# Patient Record
Sex: Female | Born: 1970 | Race: Black or African American | Hispanic: No | Marital: Married | Smoking: Never smoker
Health system: Southern US, Community
[De-identification: ages and names within clinical notes are randomized; demographics above are authoritative.]

## PROBLEM LIST (undated history)

## (undated) DIAGNOSIS — E079 Disorder of thyroid, unspecified: Secondary | ICD-10-CM

## (undated) DIAGNOSIS — M797 Fibromyalgia: Secondary | ICD-10-CM

---

## 2001-10-28 ENCOUNTER — Emergency Department (HOSPITAL_COMMUNITY): Admission: EM | Admit: 2001-10-28 | Discharge: 2001-10-28 | Payer: Self-pay

## 2005-10-10 ENCOUNTER — Encounter: Admission: RE | Admit: 2005-10-10 | Discharge: 2005-10-10 | Payer: Self-pay | Admitting: Family Medicine

## 2005-10-17 ENCOUNTER — Encounter: Admission: RE | Admit: 2005-10-17 | Discharge: 2005-10-17 | Payer: Self-pay | Admitting: Family Medicine

## 2008-07-26 ENCOUNTER — Encounter (HOSPITAL_COMMUNITY): Admission: RE | Admit: 2008-07-26 | Discharge: 2008-08-25 | Payer: Self-pay | Admitting: Internal Medicine

## 2008-08-12 ENCOUNTER — Encounter (HOSPITAL_COMMUNITY): Admission: RE | Admit: 2008-08-12 | Discharge: 2008-11-10 | Payer: Self-pay | Admitting: Internal Medicine

## 2009-08-30 ENCOUNTER — Emergency Department (HOSPITAL_COMMUNITY): Admission: EM | Admit: 2009-08-30 | Discharge: 2009-08-30 | Payer: Self-pay | Admitting: Emergency Medicine

## 2010-04-19 ENCOUNTER — Emergency Department (HOSPITAL_COMMUNITY): Admission: EM | Admit: 2010-04-19 | Discharge: 2010-04-19 | Payer: Self-pay | Admitting: Emergency Medicine

## 2010-05-06 IMAGING — CT CT ABD-PELV W/ CM
1 series · 16 of 32 positions shown, 20 images · IV contrast (agent unspecified)
Comparison: None.

CLINICAL DATA: Abdominal pain and nausea.  Previous C-section.

CT ABDOMEN AND PELVIS WITH CONTRAST
TECHNIQUE: Multidetector CT imaging of the abdomen and pelvis was
performed following the standard protocol during bolus
administration of intravenous contrast.
Contrast: 100 ml Ymnipaque-SEE

[Series 2: rtn ap with st · axial · 0.66mm/px · z∈[-466,-42]mm · 16 of 96 slices shown, 20 images]
[im 7/96  soft-tissue]
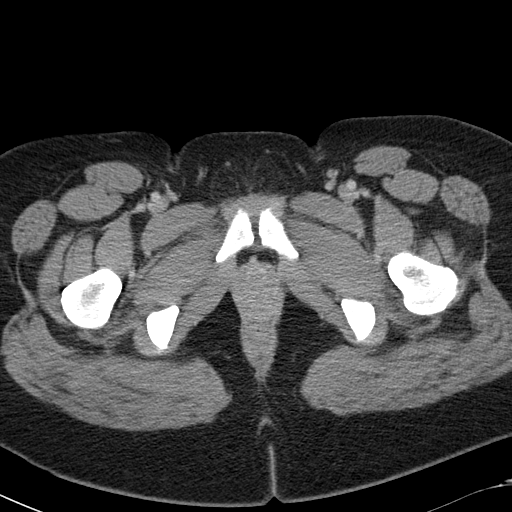
[im 7/96  bone]
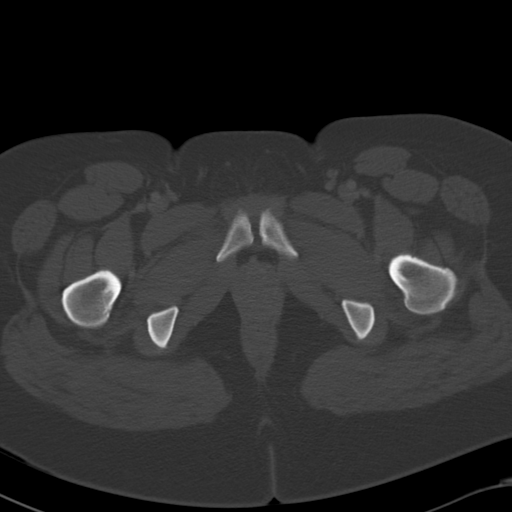
[im 13/96  soft-tissue]
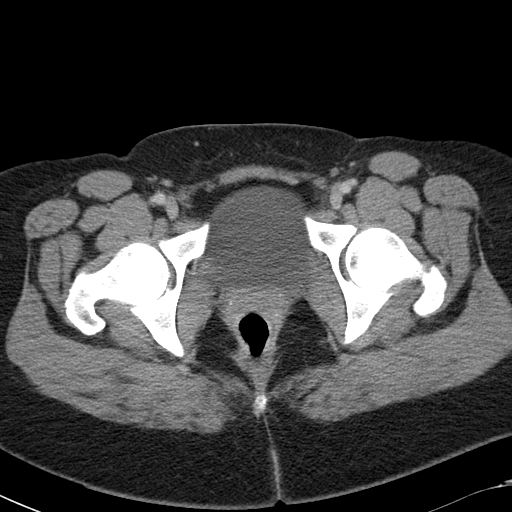
[im 19/96  soft-tissue]
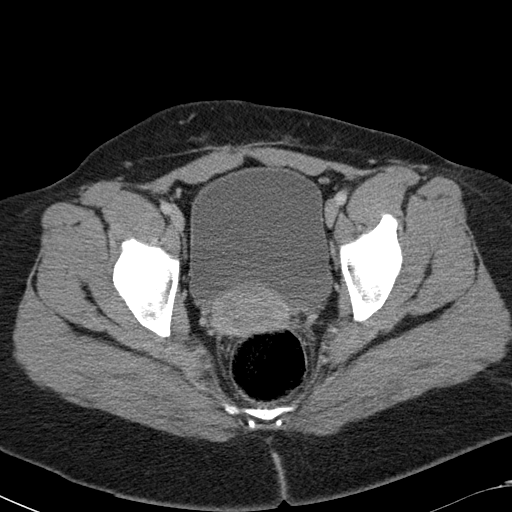
[im 25/96  soft-tissue]
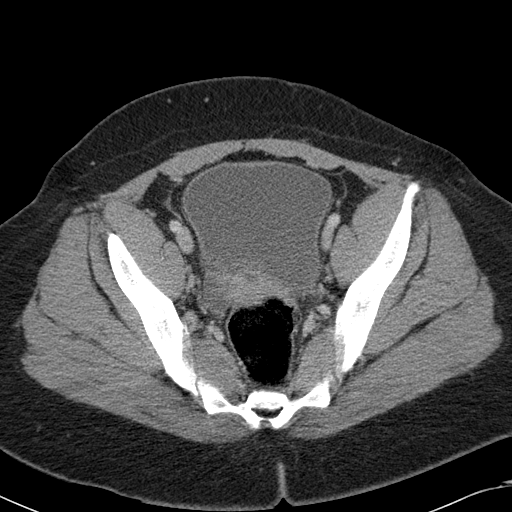
[im 31/96  soft-tissue]
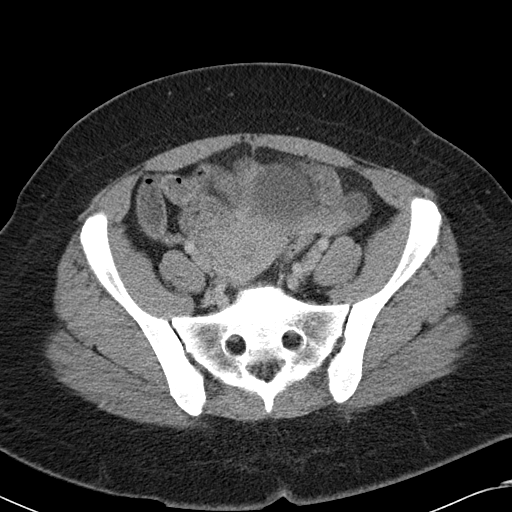
[im 37/96  soft-tissue]
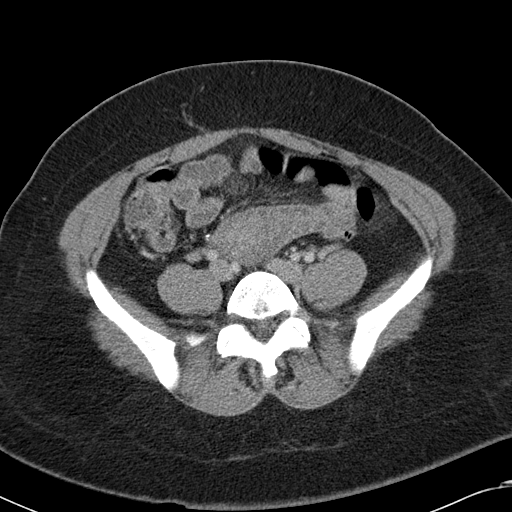
[im 43/96  soft-tissue]
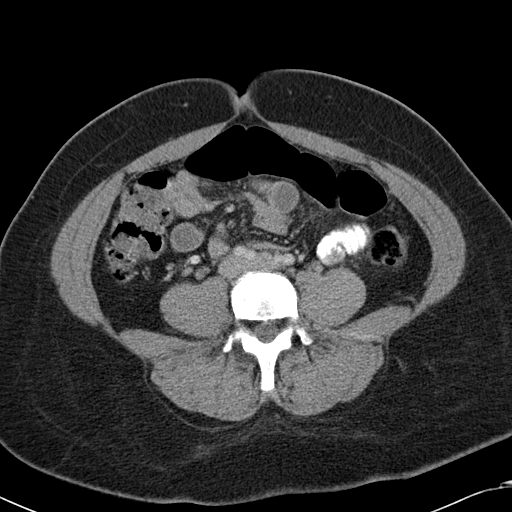
[im 53/96  soft-tissue]
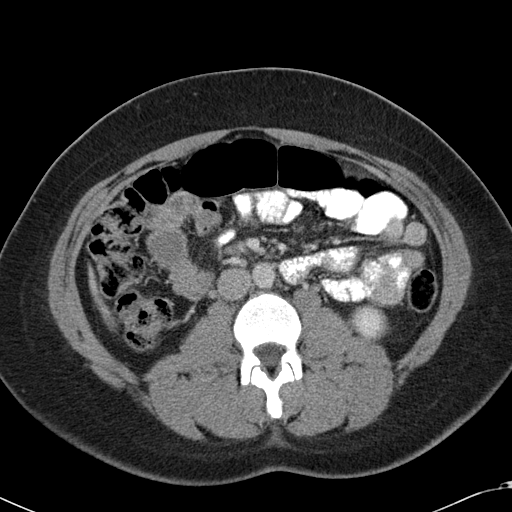
[im 59/96  soft-tissue]
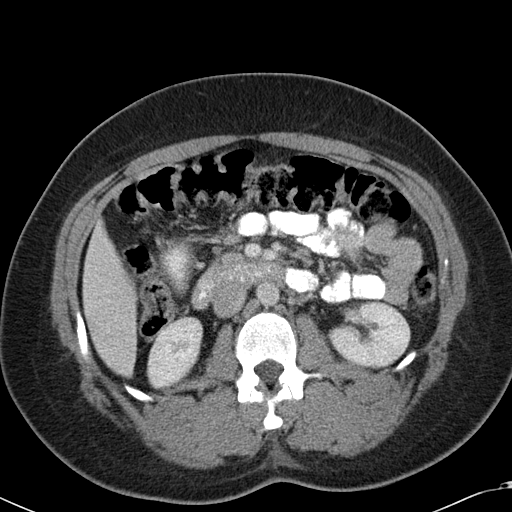
[im 59/96  bone]
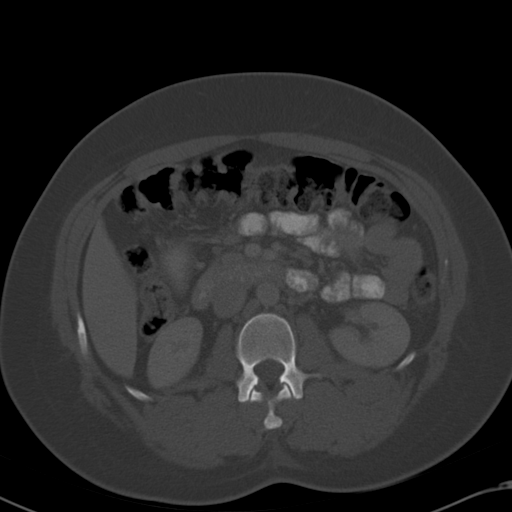
[im 65/96  soft-tissue]
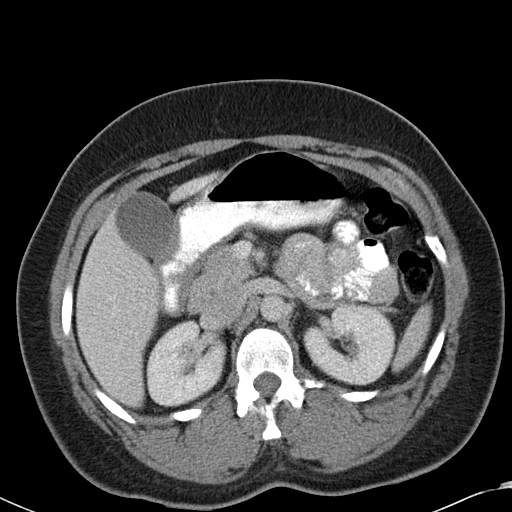
[im 71/96  soft-tissue]
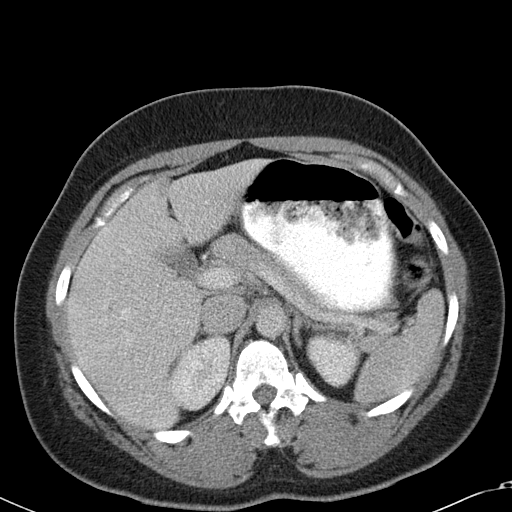
[im 77/96  soft-tissue]
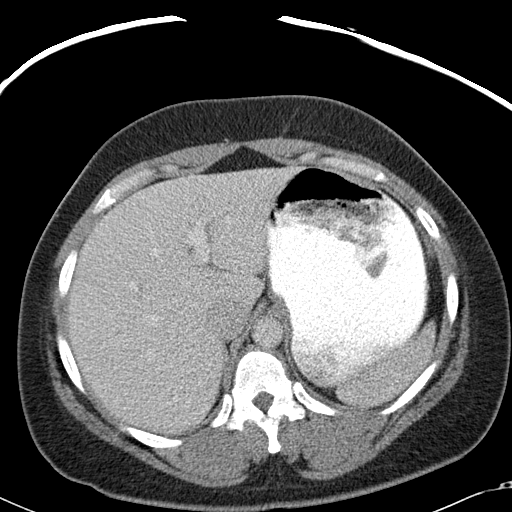
[im 83/96  soft-tissue]
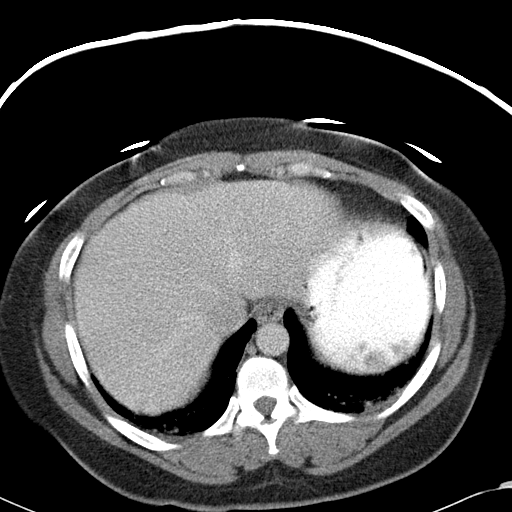
[im 83/96  lung]
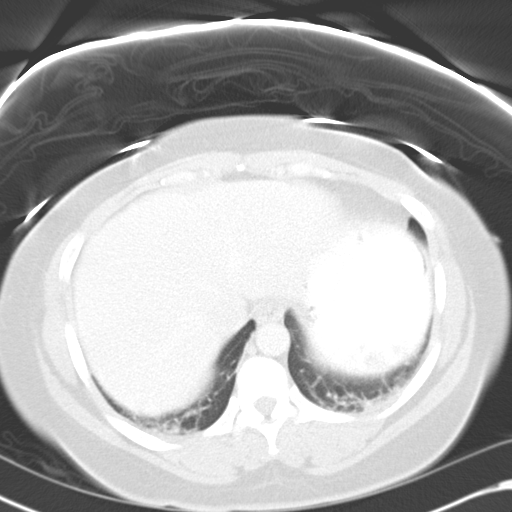
[im 86/96  lung]
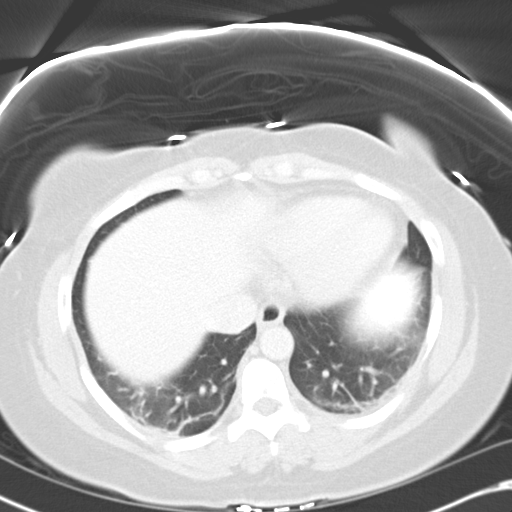
[im 89/96  soft-tissue]
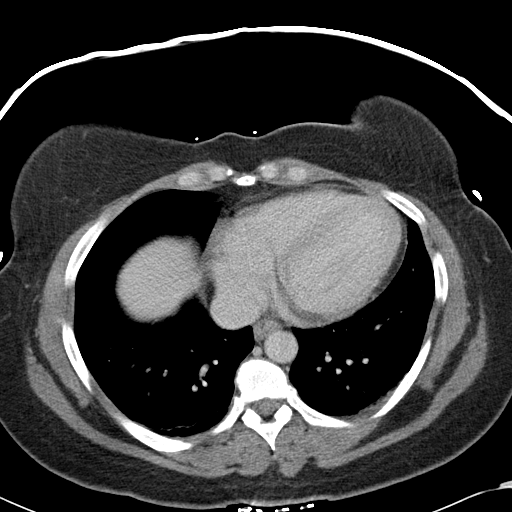
[im 89/96  lung]
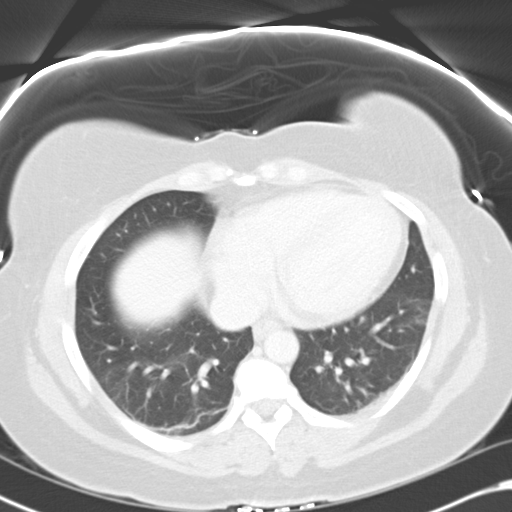
[im 92/96  lung]
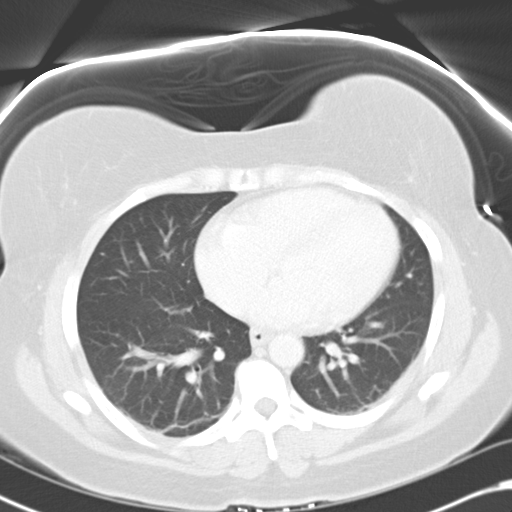

[16 of 32 positions shown; findings below may reference images not displayed]

FINDINGS: Normal appearing liver, spleen, pancreas, gallbladder,
adrenal glands, kidneys, urinary bladder, uterus and ovaries
containing follicles.  Mild diffuse low density wall thickening
involving the gastric antrum, measuring 5 mm in maximum thickness.
No intestinal abnormalities or enlarged lymph nodes.  Normal
appearing appendix.  No free peritoneal fluid or air.  Mild
atelectasis or scarring in both lung bases.  Mild disc space
narrowing, vacuum phenomenon and diffuse posterior disc bulging and
spur formation at the L4-5 level.
IMPRESSION: 1.  Mild diffuse low density antral wall thickening, most likely
due to gastritis.
2.  L4-5 degenerative changes.

## 2010-06-24 ENCOUNTER — Encounter: Payer: Self-pay | Admitting: Internal Medicine

## 2010-08-26 LAB — COMPREHENSIVE METABOLIC PANEL
ALT: 13 U/L (ref 0–35)
ALT: 15 U/L (ref 0–35)
Alkaline Phosphatase: 66 U/L (ref 39–117)
Alkaline Phosphatase: 67 U/L (ref 39–117)
BUN: 7 mg/dL (ref 6–23)
BUN: 8 mg/dL (ref 6–23)
CO2: 25 mEq/L (ref 19–32)
Calcium: 8.5 mg/dL (ref 8.4–10.5)
Chloride: 106 mEq/L (ref 96–112)
GFR calc non Af Amer: >60 mL/min (ref >60)
Glucose, Bld: 66 mg/dL — ABNORMAL LOW (ref 70–99)
Glucose, Bld: 84 mg/dL (ref 70–99)
Potassium: 4 mEq/L (ref 3.5–5.1)
Sodium: 137 mEq/L (ref 135–145)
Sodium: 138 mEq/L (ref 135–145)
Total Bilirubin: 0.7 mg/dL (ref 0.3–1.2)

## 2010-08-26 LAB — URINALYSIS, ROUTINE W REFLEX MICROSCOPIC
Bilirubin Urine: NEGATIVE
Glucose, UA: NEGATIVE mg/dL
Ketones, ur: NEGATIVE mg/dL
Nitrite: NEGATIVE
Protein, ur: NEGATIVE mg/dL

## 2010-08-26 LAB — CBC
HCT: 34.5 % — ABNORMAL LOW (ref 36.0–46.0)
Hemoglobin: 11.3 g/dL — ABNORMAL LOW (ref 12.0–15.0)
MCHC: 32.7 g/dL (ref 30.0–36.0)
RBC: 3.71 MIL/uL — ABNORMAL LOW (ref 3.87–5.11)
RDW: 14.7 % (ref 11.5–15.5)

## 2010-08-26 LAB — WET PREP, GENITAL: Yeast Wet Prep HPF POC: NONE SEEN

## 2010-08-26 LAB — URINE CULTURE: Colony Count: 30000

## 2010-08-26 LAB — LIPASE, BLOOD
Lipase: 16 U/L (ref 11–59)
Lipase: 16 U/L (ref 11–59)

## 2010-08-26 LAB — GC/CHLAMYDIA PROBE AMP, GENITAL
Chlamydia, DNA Probe: NEGATIVE
GC Probe Amp, Genital: NEGATIVE

## 2010-09-13 LAB — HCG, SERUM, QUALITATIVE: Preg, Serum: NEGATIVE

## 2011-07-25 ENCOUNTER — Encounter (HOSPITAL_BASED_OUTPATIENT_CLINIC_OR_DEPARTMENT_OTHER): Payer: Self-pay | Admitting: *Deleted

## 2011-07-25 ENCOUNTER — Other Ambulatory Visit: Payer: Self-pay

## 2011-07-25 ENCOUNTER — Emergency Department (HOSPITAL_BASED_OUTPATIENT_CLINIC_OR_DEPARTMENT_OTHER)
Admission: EM | Admit: 2011-07-25 | Discharge: 2011-07-25 | Disposition: A | Discharge status: Home / Self Care | Payer: Self-pay | Attending: Emergency Medicine | Admitting: Emergency Medicine

## 2011-07-25 DIAGNOSIS — K219 Gastro-esophageal reflux disease without esophagitis: Secondary | ICD-10-CM | POA: Insufficient documentation

## 2011-07-25 DIAGNOSIS — R079 Chest pain, unspecified: Secondary | ICD-10-CM | POA: Insufficient documentation

## 2011-07-25 HISTORY — DX: Disorder of thyroid, unspecified: E07.9

## 2011-07-25 LAB — DIFFERENTIAL
Basophils Absolute: 0 10*3/uL (ref 0.0–0.1)
Basophils Relative: 0 % (ref 0–1)
Eosinophils Absolute: 0.3 10*3/uL (ref 0.0–0.7)
Neutro Abs: 4.5 10*3/uL (ref 1.7–7.7)
Neutrophils Relative %: 69 % (ref 43–77)

## 2011-07-25 LAB — CBC
MCH: 29.1 pg (ref 26.0–34.0)
MCHC: 32.8 g/dL (ref 30.0–36.0)
Platelets: 263 10*3/uL (ref 150–400)

## 2011-07-25 LAB — COMPREHENSIVE METABOLIC PANEL
ALT: 11 U/L (ref 0–35)
AST: 14 U/L (ref 0–37)
Albumin: 3 g/dL — ABNORMAL LOW (ref 3.5–5.2)
Alkaline Phosphatase: 100 U/L (ref 39–117)
Potassium: 4.1 mEq/L (ref 3.5–5.1)
Sodium: 139 mEq/L (ref 135–145)
Total Protein: 7.2 g/dL (ref 6.0–8.3)

## 2011-07-25 LAB — CARDIAC PANEL(CRET KIN+CKTOT+MB+TROPI)
Relative Index: 1.4 (ref 0.0–2.5)
Total CK: 128 U/L (ref 7–177)

## 2011-07-25 MED ORDER — RANITIDINE HCL 150 MG PO TABS: 150.0000 mg | ORAL_TABLET | Freq: Two times a day (BID) | ORAL | Status: AC

## 2011-07-25 MED ORDER — GI COCKTAIL ~~LOC~~
30.0000 mL | Freq: Once | ORAL | Status: AC
  Administered 2011-07-25: 30 mL via ORAL
  Filled 2011-07-25: qty 30

## 2011-07-25 NOTE — ED Provider Notes (Addendum)
History     CSN: [id number]  Arrival date & time 07/25/11  1114   First MD Initiated Contact with Patient 07/25/11 1133      Chief Complaint  Patient presents with  . Chest Pain    (Consider location/radiation/quality/duration/timing/severity/associated sxs/prior treatment) Patient is a 41 y.o. female presenting with chest pain. The history is provided by the patient.  Chest Pain The chest pain began yesterday. Duration of episode(s) is 12 hours. Chest pain occurs constantly. The chest pain is unchanged. Associated with: started after eating. At its most intense, the pain is at 5/10. The pain is currently at 5/10. The severity of the pain is moderate. The quality of the pain is described as pressure-like, burning, aching and squeezing. The pain does not radiate. Chest pain is worsened by certain positions (worse with lying down and bending over). Pertinent negatives for primary symptoms include no fever, no fatigue, no syncope, no shortness of breath, no cough, no wheezing, no palpitations, no abdominal pain, no nausea and no vomiting.  Pertinent negatives for associated symptoms include no diaphoresis, no lower extremity edema, no orthopnea and no weakness. She tried aspirin for the symptoms. Risk factors include no known risk factors.  Pertinent negatives for past medical history include no CAD, no diabetes, no DVT, no hyperlipidemia and no hypertension.  Pertinent negatives for family medical history include: no heart disease in family and no early MI in family.     Past Medical History  Diagnosis Date  . Thyroid disease     History reviewed. No pertinent past surgical history.  History reviewed. No pertinent family history.  History  Substance Use Topics  . Smoking status: Not on file  . Smokeless tobacco: Not on file  . Alcohol Use:     OB History    Grav Para Term Preterm Abortions TAB SAB Ect Mult Living                  Review of Systems  Constitutional:  Negative for fever, diaphoresis and fatigue.  Respiratory: Negative for cough, shortness of breath and wheezing.   Cardiovascular: Positive for chest pain. Negative for palpitations, orthopnea and syncope.  Gastrointestinal: Negative for nausea, vomiting and abdominal pain.  Neurological: Negative for weakness.  All other systems reviewed and are negative.    Allergies  Review of patient's allergies indicates no known allergies.  Home Medications   Current Outpatient Rx  Name Route Sig Dispense Refill  . DESOGESTREL-ETHINYL ESTRADIOL 0.15-0.02/0.01 MG (21/5) PO TABS Oral Take 1 tablet by mouth daily.    Marland Kitchen LEVOTHYROXINE SODIUM 150 MCG PO TABS Oral Take 150 mcg by mouth daily.      BP 144/91  Pulse 88  Temp(Src) 98.2 F (36.8 C) (Oral)  Resp 18  Ht 5\' 5"  (1.651 m)  Wt 256 lb (116.121 kg)  BMI 42.60 kg/m2  SpO2 100%  LMP 07/11/2011  Physical Exam  Nursing note and vitals reviewed. Constitutional: She is oriented to person, place, and time. She appears well-developed and well-nourished. No distress.  HENT:  Head: Normocephalic and atraumatic.  Mouth/Throat: Oropharynx is clear and moist.  Eyes: Conjunctivae and EOM are normal. Pupils are equal, round, and reactive to light.  Neck: Normal range of motion. Neck supple.  Cardiovascular: Normal rate, regular rhythm and intact distal pulses.   No murmur heard. Pulmonary/Chest: Effort normal and breath sounds normal. No respiratory distress. She has no wheezes. She has no rales. She exhibits tenderness.  Abdominal: Soft. She exhibits no  distension. There is no tenderness. There is no rebound and no guarding.  Musculoskeletal: Normal range of motion. She exhibits no edema and no tenderness.  Neurological: She is alert and oriented to person, place, and time.  Skin: Skin is warm and dry. No rash noted. No erythema.  Psychiatric: She has a normal mood and affect. Her behavior is normal.    ED Course  Procedures (including  critical care time)  Labs Reviewed  CBC - Abnormal; Notable for the following:    Hemoglobin 11.8 (*)    All other components within normal limits  COMPREHENSIVE METABOLIC PANEL - Abnormal; Notable for the following:    Albumin 3.0 (*)    GFR calc non Af Amer 80 (*)    All other components within normal limits  DIFFERENTIAL  CARDIAC PANEL(CRET KIN+CKTOT+MB+TROPI)   No results found.   Date: 07/25/2011  Rate: 84  Rhythm: normal sinus rhythm  QRS Axis: normal  Intervals: normal  ST/T Wave abnormalities: normal  Conduction Disutrbances:none  Narrative Interpretation:   Old EKG Reviewed: unchanged from EKG at urgent care   1. GERD (gastroesophageal reflux disease)       MDM   Patient with chest pain that developed last night after dinner. She states it feels like a pressure and it's worse with lying down and bending over. She has no shortness of breath, diaphoresis, nausea, vomiting. She was seen at [facility] and had a normal EKG and chest x-ray but was sent here for further evaluation. Patient has no symptoms concerning for PE at this time. Low concern for cardiac cause as she is a TIMI 0 and symptoms are more suggestive of GERD. However since symptoms have been ongoing for over 12 hours feel 1 set of cardiac enzymes would be sufficient to rule patient out.  She just had a chest x-ray done today that was within normal limits will not repeat.  12:30 PM All labs within normal limits. This is unlikely to be cardiac or respiratory in origin. Feel most likely GERD. After her GI cocktail improvement in symptoms    Gwyneth Sprout, MD 07/25/11 1303  Gwyneth Sprout, MD 07/25/11 1325

## 2011-07-25 NOTE — Discharge Instructions (Signed)
Diet for GERD or PUD Nutrition therapy can help ease the discomfort of gastroesophageal reflux disease (GERD) and peptic ulcer disease (PUD).  HOME CARE INSTRUCTIONS   Eat your meals slowly, in a relaxed setting.   Eat 5 to 6 small meals per day.   If a food causes distress, stop eating it for a period of time.  FOODS TO AVOID  Coffee, regular or decaffeinated.   Cola beverages, regular or low calorie.   Tea, regular or decaffeinated.   Pepper.   Cocoa.   High fat foods, including meats.   Butter, margarine, hydrogenated oil (trans fats).   Peppermint or spearmint (if you have GERD).   Fruits and vegetables if not tolerated.   Alcohol.   Nicotine (smoking or chewing). This is one of the most potent stimulants to acid production in the gastrointestinal tract.   Any food that seems to aggravate your condition.  If you have questions regarding your diet, ask your caregiver or a registered dietitian. TIPS  Lying flat may make symptoms worse. Keep the head of your bed raised 6 to 9 inches (15 to 23 cm) by using a foam wedge or blocks under the legs of the bed.   Do not lay down until 3 hours after eating a meal.   Daily physical activity may help reduce symptoms.  MAKE SURE YOU:   Understand these instructions.   Will watch your condition.   Will get help right away if you are not doing well or get worse.  Document Released: 05/20/2005 Document Revised: 01/30/2011 Document Reviewed: 10/03/2008 [employer] Patient Information 2012 [employer]. 

## 2011-07-25 NOTE — ED Notes (Signed)
Pt amb to room 5 with quick steady gait, declines w/c for transport. Pt reports chest pain since last night, states pain increases with moving her arm, lying down, deep inspiration, and bending over. Pt seen at [facility] prior to her arrival here.

## 2020-04-10 ENCOUNTER — Ambulatory Visit: Payer: BC Managed Care – PPO | Admitting: Psychologist

## 2020-04-19 ENCOUNTER — Ambulatory Visit: Payer: BC Managed Care – PPO | Admitting: Psychology

## 2020-06-08 ENCOUNTER — Ambulatory Visit: Payer: BC Managed Care – PPO | Admitting: Psychologist

## 2021-05-25 ENCOUNTER — Encounter (HOSPITAL_BASED_OUTPATIENT_CLINIC_OR_DEPARTMENT_OTHER): Payer: Self-pay | Admitting: Emergency Medicine

## 2021-05-25 ENCOUNTER — Ambulatory Visit: Admission: EM | Admit: 2021-05-25 | Discharge: 2021-05-25 | Payer: BC Managed Care – PPO

## 2021-05-25 ENCOUNTER — Emergency Department (HOSPITAL_BASED_OUTPATIENT_CLINIC_OR_DEPARTMENT_OTHER)
Admission: EM | Admit: 2021-05-25 | Discharge: 2021-05-25 | Disposition: A | Discharge status: Home / Self Care | Payer: BC Managed Care – PPO | Attending: Emergency Medicine | Admitting: Emergency Medicine

## 2021-05-25 ENCOUNTER — Other Ambulatory Visit: Payer: Self-pay

## 2021-05-25 DIAGNOSIS — I10 Essential (primary) hypertension: Secondary | ICD-10-CM | POA: Diagnosis not present

## 2021-05-25 DIAGNOSIS — H538 Other visual disturbances: Secondary | ICD-10-CM

## 2021-05-25 DIAGNOSIS — Z79899 Other long term (current) drug therapy: Secondary | ICD-10-CM | POA: Insufficient documentation

## 2021-05-25 DIAGNOSIS — Z91040 Latex allergy status: Secondary | ICD-10-CM | POA: Diagnosis not present

## 2021-05-25 DIAGNOSIS — E039 Hypothyroidism, unspecified: Secondary | ICD-10-CM | POA: Diagnosis not present

## 2021-05-25 HISTORY — DX: Fibromyalgia: M79.7

## 2021-05-25 LAB — BASIC METABOLIC PANEL
Anion gap: 8 (ref 5–15)
BUN: 10 mg/dL (ref 6–20)
CO2: 22 mmol/L (ref 22–32)
Calcium: 8.7 mg/dL — ABNORMAL LOW (ref 8.9–10.3)
Chloride: 106 mmol/L (ref 98–111)
Creatinine, Ser: 0.89 mg/dL (ref 0.44–1.00)
GFR, Estimated: >60 mL/min (ref >60)
Glucose, Bld: 85 mg/dL (ref 70–99)
Potassium: 4.2 mmol/L (ref 3.5–5.1)
Sodium: 136 mmol/L (ref 135–145)

## 2021-05-25 LAB — CBC WITH DIFFERENTIAL/PLATELET
Abs Immature Granulocytes: 0.01 10*3/uL (ref 0.00–0.07)
Basophils Absolute: 0 10*3/uL (ref 0.0–0.1)
Basophils Relative: 1 %
Eosinophils Absolute: 0.2 10*3/uL (ref 0.0–0.5)
Eosinophils Relative: 3 %
HCT: 40.2 % (ref 36.0–46.0)
Hemoglobin: 13.4 g/dL (ref 12.0–15.0)
Immature Granulocytes: 0 %
Lymphocytes Relative: 30 %
Lymphs Abs: 1.4 10*3/uL (ref 0.7–4.0)
MCH: 32.6 pg (ref 26.0–34.0)
MCHC: 33.3 g/dL (ref 30.0–36.0)
MCV: 97.8 fL (ref 80.0–100.0)
Monocytes Absolute: 0.3 10*3/uL (ref 0.1–1.0)
Monocytes Relative: 6 %
Neutro Abs: 2.9 10*3/uL (ref 1.7–7.7)
Neutrophils Relative %: 60 %
Platelets: 247 10*3/uL (ref 150–400)
RBC: 4.11 MIL/uL (ref 3.87–5.11)
RDW: 12.9 % (ref 11.5–15.5)
WBC: 4.8 10*3/uL (ref 4.0–10.5)
nRBC: 0 % (ref 0.0–0.2)

## 2021-05-25 MED ORDER — AMLODIPINE BESYLATE 5 MG PO TABS
5.0000 mg | ORAL_TABLET | Freq: Once | ORAL | Status: AC
  Administered 2021-05-25: 14:00:00 5 mg via ORAL
  Filled 2021-05-25: qty 1

## 2021-05-25 MED ORDER — AMLODIPINE BESYLATE 5 MG PO TABS: 5.0000 mg | ORAL_TABLET | Freq: Every day | ORAL | 0 refills | Status: AC

## 2021-05-25 NOTE — ED Provider Notes (Signed)
[facility]    CSN: [id number] Arrival date & time: 05/25/21  6546      History   Chief Complaint Chief Complaint  Patient presents with   Headache   Hypertension   Dizziness    HPI Hannah Johnson is a 50 y.o. female.   Patient here today for evaluation of hypertension, dizziness and headache that she has had the last several days.  She states today that she started to have blurred vision which brought her into the office.  She states yesterday her blood pressure was 200/110.  She does not have history of hypertension.  She does have history of hypothyroidism.  She denies any chest pain or shortness of breath.  The history is provided by the patient.  Headache Associated symptoms: dizziness   Associated symptoms: no abdominal pain, no fever, no nausea and no vomiting   Hypertension Associated symptoms include headaches. Pertinent negatives include no chest pain, no abdominal pain and no shortness of breath.  Dizziness Associated symptoms: headaches   Associated symptoms: no chest pain, no nausea, no shortness of breath and no vomiting    Past Medical History:  Diagnosis Date   Fibromyalgia    Thyroid disease     There are no problems to display for this patient.   Past Surgical History:  Procedure Laterality Date   CESAREAN SECTION     ENDOMETRIAL ABLATION     LAPAROSCOPIC GASTRIC SLEEVE RESECTION      OB History   No obstetric history on file.      Home Medications    Prior to Admission medications   Medication Sig Start Date End Date Taking? Authorizing Provider  diclofenac (VOLTAREN) 50 MG EC tablet Take by mouth. 11/02/16  Yes [provider]  Docusate Sodium (DSS) 100 MG CAPS Take by mouth. 04/29/16  Yes [provider]  gabapentin (NEURONTIN) 300 MG capsule Take by mouth. 07/13/15  Yes [provider]  levothyroxine (SYNTHROID) 88 MCG tablet TAKE 1 TABLET BY MOUTH 5 DAYS A WEEK AND 1&1/2 TABLETS ON FRIDAY AND  SUNDAY 04/08/16  Yes [provider]  cyclobenzaprine (FLEXERIL) 10 MG tablet Take 10 mg by mouth at bedtime. 02/22/21   [provider]  desogestrel-ethinyl estradiol (KARIVA,AZURETTE,MIRCETTE) 0.15-0.02/0.01 MG (21/5) tablet Take 1 tablet by mouth daily.    [provider]  levothyroxine (SYNTHROID, LEVOTHROID) 150 MCG tablet Take 150 mcg by mouth daily.    [provider]  medroxyPROGESTERone (DEPO-PROVERA) 150 MG/ML injection Inject 150 mg into the muscle every 3 (three) months. 03/12/21   [provider]  meloxicam (MOBIC) 15 MG tablet Take by mouth.    [provider]  ranitidine (ZANTAC) 150 MG tablet Take 1 tablet (150 mg total) by mouth 2 (two) times daily. 07/25/11 07/24/12  Gwyneth Sprout, MD    Family History History reviewed. No pertinent family history.  Social History Social History   Tobacco Use   Smoking status: Never   Smokeless tobacco: Never  Vaping Use   Vaping Use: Never used  Substance Use Topics   Alcohol use: Yes    Comment: social drinker   Drug use: Never     Allergies   Latex, Amantadine, Cetylpyridinium, Erythromycin, and Phenol   Review of Systems Review of Systems  Constitutional:  Negative for chills and fever.  Eyes:  Positive for visual disturbance. Negative for discharge and redness.  Respiratory:  Negative for shortness of breath and wheezing.   Cardiovascular:  Negative for chest  pain.  Gastrointestinal:  Negative for abdominal pain, nausea and vomiting.  Genitourinary:  Positive for vaginal bleeding and vaginal discharge.  Neurological:  Positive for dizziness and headaches.    Physical Exam Triage Vital Signs ED Triage Vitals  Enc Vitals Group     BP 05/25/21 1010 (!) 157/107     Pulse Rate 05/25/21 1010 95     Resp 05/25/21 1010 16     Temp 05/25/21 1010 98.4 F (36.9 C)     Temp Source 05/25/21 1010 Oral     SpO2 05/25/21 1010 97 %     Weight --      Height --      Head  Circumference --      Peak Flow --      Pain Score 05/25/21 1016 3     Pain Loc --      Pain Edu? --      Excl. in [place]? --    No data found.  Updated Vital Signs BP (!) 157/107 (BP Location: Right Arm)    Pulse 95    Temp 98.4 F (36.9 C) (Oral)    Resp 16    SpO2 97%      Physical Exam Vitals and nursing note reviewed.  Constitutional:      General: She is not in acute distress.    Appearance: Normal appearance. She is not ill-appearing.  HENT:     Head: Normocephalic and atraumatic.  Eyes:     Conjunctiva/sclera: Conjunctivae normal.  Cardiovascular:     Rate and Rhythm: Normal rate and regular rhythm.     Heart sounds: Normal heart sounds. No murmur heard. Pulmonary:     Effort: Pulmonary effort is normal. No respiratory distress.     Breath sounds: Normal breath sounds. No wheezing, rhonchi or rales.  Neurological:     Mental Status: She is alert.  Psychiatric:        Mood and Affect: Mood normal.        Behavior: Behavior normal.        Thought Content: Thought content normal.     UC Treatments / Results  Labs (all labs ordered are listed, but only abnormal results are displayed) Labs Reviewed - No data to display  EKG   Radiology No results found.  Procedures Procedures (including critical care time)  Medications Ordered in UC Medications - No data to display  Initial Impression / Assessment and Plan / UC Course  I have reviewed the triage vital signs and the nursing notes.  Pertinent labs & imaging results that were available during my care of the patient were reviewed by me and considered in my medical decision making (see chart for details).    Given current symptoms with elevated blood pressure that is abnormal for patient recommended follow-up in the emergency department for further evaluation, specifically stat labs.  Patient is agreeable to same.  Final Clinical Impressions(s) / UC Diagnoses   Final diagnoses:  Hypertension, unspecified  type  Blurred vision     Discharge Instructions      Please report to [facility] after leaving our office  [place], [place] [address]     ED Prescriptions   None    PDMP not reviewed this encounter.   Francene Finders, PA-C 05/25/21 1026

## 2021-05-25 NOTE — ED Triage Notes (Signed)
Reports bp was elevated yesterday with SBP greater than 200.  Seen at [facility] today.  SBP 150's.  Reports they told her to come here.  C/o headache for several days that comes and goes and blurred vision that started around 8am.  Does endorses she felt dizzy when she woke up around 0600.

## 2021-05-25 NOTE — ED Notes (Signed)
Pt provided ginger ale and chips, per her request.  EDP aware and agreeable.

## 2021-05-25 NOTE — ED Provider Notes (Signed)
[facility] EMERGENCY DEPARTMENT Provider Note   CSN: [id number] Arrival date & time: 05/25/21  1047     History Chief Complaint  Patient presents with   Hypertension    Hannah Johnson is a 50 y.o. female with medical history significant for hypothyroidism.  Patient states that for the "last few weeks"patient has had elevated blood pressure readings.  Patient states that she has been going to [facility] for blood pressure measurements and they have been consistently in the 180-200 systolic range.  Patient states that she was last seen by her PCP in July and had a largely negative work-up with normal blood pressure reading.  Patient states that she has been under tremendous amount of stress recently and the stress is multifactorial including family issues, her job.  Patient presented to an urgent care today where they advised her to come to the ED.  Patient endorses headache, blurred vision.  Patient denies chest pain, shortness of breath, nausea, vomiting, diarrhea, abdominal pain, urinary issues.  Hypertension Associated symptoms include headaches. Pertinent negatives include no chest pain, no abdominal pain and no shortness of breath.  Hypertension Associated symptoms include headaches. Pertinent negatives include no chest pain, no abdominal pain and no shortness of breath.  Hypertension Associated symptoms include headaches. Pertinent negatives include no chest pain and no shortness of breath.  Hypertension Pertinent negatives include no chest pain and no shortness of breath.  Hypertension Pertinent negatives include no shortness of breath.      Past Medical History:  Diagnosis Date   Fibromyalgia    Thyroid disease     There are no problems to display for this patient.   Past Surgical History:  Procedure Laterality Date   CESAREAN SECTION     ENDOMETRIAL ABLATION     LAPAROSCOPIC GASTRIC SLEEVE RESECTION       OB History   No obstetric history on file.      No family history on file.  Social History   Tobacco Use   Smoking status: Never   Smokeless tobacco: Never  Vaping Use   Vaping Use: Never used  Substance Use Topics   Alcohol use: Yes    Comment: social drinker   Drug use: Never    Home Medications Prior to Admission medications   Medication Sig Start Date End Date Taking? Authorizing Provider  amLODipine (NORVASC) 5 MG tablet Take 1 tablet (5 mg total) by mouth daily. 05/25/21  Yes Al Decant, PA-C  cyclobenzaprine (FLEXERIL) 10 MG tablet Take 10 mg by mouth at bedtime. 02/22/21   [provider]  desogestrel-ethinyl estradiol (KARIVA,AZURETTE,MIRCETTE) 0.15-0.02/0.01 MG (21/5) tablet Take 1 tablet by mouth daily.    [provider]  diclofenac (VOLTAREN) 50 MG EC tablet Take by mouth. 11/02/16   [provider]  Docusate Sodium (DSS) 100 MG CAPS Take by mouth. 04/29/16   [provider]  gabapentin (NEURONTIN) 300 MG capsule Take by mouth. 07/13/15   [provider]  levothyroxine (SYNTHROID) 88 MCG tablet TAKE 1 TABLET BY MOUTH 5 DAYS A WEEK AND 1&1/2 TABLETS ON FRIDAY AND SUNDAY 04/08/16   [provider]  levothyroxine (SYNTHROID, LEVOTHROID) 150 MCG tablet Take 150 mcg by mouth daily.    [provider]  medroxyPROGESTERone (DEPO-PROVERA) 150 MG/ML injection Inject 150 mg into the muscle every 3 (three) months. 03/12/21   [provider]  meloxicam (MOBIC) 15 MG tablet Take by mouth.    [provider]  ranitidine (ZANTAC) 150 MG tablet  Take 1 tablet (150 mg total) by mouth 2 (two) times daily. 07/25/11 07/24/12  Blanchie Dessert, MD    Allergies    Latex, Amantadine, Cetylpyridinium, Erythromycin, and Phenol  Review of Systems   Review of Systems  Eyes:  Positive for visual disturbance.  Respiratory:  Negative for shortness of breath.   Cardiovascular:  Negative for chest pain.  Gastrointestinal:  Negative for abdominal pain,  diarrhea, nausea and vomiting.  Genitourinary:  Negative for difficulty urinating.  Neurological:  Positive for headaches.  All other systems reviewed and are negative.  Physical Exam Updated Vital Signs BP (!) 171/107 (BP Location: Right Arm)    Pulse 82    Temp 98.4 F (36.9 C) (Oral)    Resp 15    Ht 5' 5.5" (1.664 m)    Wt 111.1 kg    SpO2 99%    BMI 40.13 kg/m   Physical Exam Vitals and nursing note reviewed.  Constitutional:      General: She is not in acute distress.    Appearance: She is not ill-appearing or toxic-appearing.  HENT:     Head: Normocephalic.     Nose: Nose normal.     Mouth/Throat:     Mouth: Mucous membranes are moist.  Eyes:     General: No visual field deficit.    Pupils: Pupils are equal, round, and reactive to light.  Cardiovascular:     Rate and Rhythm: Normal rate and regular rhythm.  Pulmonary:     Effort: Pulmonary effort is normal.     Breath sounds: Normal breath sounds. No wheezing.  Abdominal:     General: Abdomen is flat.     Palpations: Abdomen is soft.     Tenderness: There is no abdominal tenderness.  Musculoskeletal:     Cervical back: Normal range of motion.  Skin:    General: Skin is warm and dry.     Capillary Refill: Capillary refill takes less than 2 seconds.  Neurological:     General: No focal deficit present.     Mental Status: She is alert and oriented to person, place, and time.     GCS: GCS eye subscore is 4. GCS verbal subscore is 5. GCS motor subscore is 6.     Cranial Nerves: Cranial nerves 2-12 are intact. No cranial nerve deficit.     Sensory: Sensation is intact. No sensory deficit.     Motor: Motor function is intact. No weakness.     Coordination: Coordination is intact. Coordination normal.     Gait: Gait is intact.    ED Results / Procedures / Treatments   Labs (all labs ordered are listed, but only abnormal results are displayed) Labs Reviewed  BASIC METABOLIC PANEL - Abnormal; Notable for the  following components:      Result Value   Calcium 8.7 (*)    All other components within normal limits  CBC WITH DIFFERENTIAL/PLATELET    EKG EKG Interpretation  Date/Time:  Friday May 25 2021 11:16:18 EST Ventricular Rate:  93 PR Interval:  155 QRS Duration: 81 QT Interval:  345 QTC Calculation: 430 R Axis:   45 Text Interpretation: Sinus rhythm Low voltage, precordial leads No significant change since last tracing Confirmed by Calvert Cantor [phone]) on 05/25/2021 11:32:28 AM  Radiology No results found.  Procedures Procedures   Medications Ordered in ED Medications  amLODipine (NORVASC) tablet 5 mg (has no administration in time range)    ED Course  I have reviewed the  triage vital signs and the nursing notes.  Pertinent labs & imaging results that were available during my care of the patient were reviewed by me and considered in my medical decision making (see chart for details).    MDM Rules/Calculators/A&P                          50 year old female with history of hypothyroidism.  Patient states that she is felt like her blood pressures been high for the last month and has been getting readings at [facility].  Patient reports that she had a systolic blood pressure greater than 200 yesterday.  She was seen in urgent care today with a systolic blood pressure reading of 150.  She is complaining of headache and blurred vision.  Also slight dizziness since this morning.  On examination, patient sitting upright speaking in full sentences complaining of slight headache and slight blurred vision.  Check labs on this patient all within normal limits.  We will start this patient on 5 mg daily of amlodipine.  I have also instructed this patient to follow-up with her PCP to get on a long-term blood pressure medication.  Patient also complaining of anxiety and general life stressors, she requested antianxiety medication.  I discussed this with her and informed her that I do  not feel comfortable prescribing her antianxiety medication here in the ED today as this medication typically just alleviate symptoms for a slight period of time and they always return.  I advised her that she needs to discuss this with her PCP and perhaps explore SSRI treatment options.  She expressed understanding and agreement with the plan for outpatient follow-up for her high blood pressure.  I discussed this patient and her case with Dr. Karle Starch who is in agreement with current plan.  The patient has been given discharge instructions and return precautions which she voices understanding with.  This patient is stable on discharge.  Final Clinical Impression(s) / ED Diagnoses Final diagnoses:  Hypertension, unspecified type    Rx / DC Orders ED Discharge Orders          Ordered    amLODipine (NORVASC) 5 MG tablet  Daily        05/25/21 1325             Lawana Chambers 05/25/21 1330    Truddie Hidden, MD 05/25/21 [phone]

## 2021-05-25 NOTE — ED Notes (Signed)
D/c paperwork reviewed with pt, including prescription and f/u care. Pt with no questions or concerns at time of d/c.  Ambulatory to ED exit with family.

## 2021-05-25 NOTE — Discharge Instructions (Signed)
Please report to [facility] [facility] after leaving our office  [address] [place], [place] [address]

## 2021-05-25 NOTE — Discharge Instructions (Addendum)
Return to ED with any new or worsening symptoms such as shortness of breath, chest pain Follow-up with your PCP in the next 1 to 2 weeks to discuss your new onset hypertension.  Also discuss your anxiety. You may pick up your prescription medication at [facility] on [address] as we discussed. We will give you your first dose of medication here today.  Begin taking your prescription tomorrow.

## 2021-05-25 NOTE — ED Triage Notes (Signed)
Patient presents to Urgent Care with complaints of headache x 2 days, blurred vision and dizziness since this morning, and elevated BP. Pt states she checked her BP yesterday and it was 200/100. No hx of HTN, last PCP she reports her BP being slightly elevated.   Denies numbness or tingling to extremities.

## 2022-08-07 ENCOUNTER — Other Ambulatory Visit: Payer: Self-pay | Admitting: Obstetrics and Gynecology

## 2022-08-07 DIAGNOSIS — N6489 Other specified disorders of breast: Secondary | ICD-10-CM

## 2022-08-27 ENCOUNTER — Ambulatory Visit
Admission: RE | Admit: 2022-08-27 | Discharge: 2022-08-27 | Disposition: A | Discharge status: Home / Self Care | Payer: BC Managed Care – PPO | Source: Ambulatory Visit | Attending: Obstetrics and Gynecology | Admitting: Obstetrics and Gynecology

## 2022-08-27 ENCOUNTER — Other Ambulatory Visit: Payer: Self-pay | Admitting: Obstetrics and Gynecology

## 2022-08-27 DIAGNOSIS — N6489 Other specified disorders of breast: Secondary | ICD-10-CM

## 2022-09-11 ENCOUNTER — Ambulatory Visit
Admission: RE | Admit: 2022-09-11 | Discharge: 2022-09-11 | Disposition: A | Discharge status: Home / Self Care | Payer: BC Managed Care – PPO | Source: Ambulatory Visit | Attending: Obstetrics and Gynecology | Admitting: Obstetrics and Gynecology

## 2022-09-11 ENCOUNTER — Other Ambulatory Visit: Payer: Self-pay | Admitting: Obstetrics and Gynecology

## 2022-09-11 DIAGNOSIS — N6489 Other specified disorders of breast: Secondary | ICD-10-CM

## 2022-09-11 HISTORY — PX: BREAST BIOPSY: SHX20

## 2022-11-28 ENCOUNTER — Other Ambulatory Visit: Payer: BC Managed Care – PPO
